# Patient Record
Sex: Female | Born: 2005
Health system: Southern US, Community
[De-identification: ages and names within clinical notes are randomized; demographics above are authoritative.]

## PROBLEM LIST (undated history)

## (undated) DIAGNOSIS — F419 Anxiety disorder, unspecified: Secondary | ICD-10-CM

## (undated) DIAGNOSIS — F88 Other disorders of psychological development: Secondary | ICD-10-CM

---
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
## 2024-01-17 DIAGNOSIS — H60333 Swimmer's ear, bilateral: Secondary | ICD-10-CM | POA: Diagnosis not present

## 2024-03-04 DIAGNOSIS — F419 Anxiety disorder, unspecified: Secondary | ICD-10-CM | POA: Diagnosis not present

## 2024-03-04 DIAGNOSIS — Z1341 Encounter for autism screening: Secondary | ICD-10-CM | POA: Diagnosis not present

## 2024-04-24 DIAGNOSIS — J189 Pneumonia, unspecified organism: Secondary | ICD-10-CM | POA: Diagnosis not present

## 2024-04-24 DIAGNOSIS — R051 Acute cough: Secondary | ICD-10-CM | POA: Diagnosis not present

## 2024-05-04 DIAGNOSIS — F419 Anxiety disorder, unspecified: Secondary | ICD-10-CM | POA: Diagnosis not present

## 2024-05-04 DIAGNOSIS — Z713 Dietary counseling and surveillance: Secondary | ICD-10-CM | POA: Diagnosis not present

## 2024-05-04 DIAGNOSIS — Z0000 Encounter for general adult medical examination without abnormal findings: Secondary | ICD-10-CM | POA: Diagnosis not present

## 2024-05-04 DIAGNOSIS — Z68.41 Body mass index (BMI) pediatric, 5th percentile to less than 85th percentile for age: Secondary | ICD-10-CM | POA: Diagnosis not present

## 2024-06-05 DIAGNOSIS — Z6820 Body mass index (BMI) 20.0-20.9, adult: Secondary | ICD-10-CM | POA: Diagnosis not present

## 2024-06-05 DIAGNOSIS — F410 Panic disorder [episodic paroxysmal anxiety] without agoraphobia: Secondary | ICD-10-CM | POA: Diagnosis not present

## 2024-06-05 DIAGNOSIS — F419 Anxiety disorder, unspecified: Secondary | ICD-10-CM | POA: Diagnosis not present

## 2024-07-20 DIAGNOSIS — F410 Panic disorder [episodic paroxysmal anxiety] without agoraphobia: Secondary | ICD-10-CM | POA: Diagnosis not present

## 2024-07-20 DIAGNOSIS — F419 Anxiety disorder, unspecified: Secondary | ICD-10-CM | POA: Diagnosis not present
